# Patient Record
Sex: Female | Born: 1985 | Race: White | Hispanic: No | Marital: Single | Smoking: Never smoker
Health system: Southern US, Community
[De-identification: ages and names within clinical notes are randomized; demographics above are authoritative.]

## PROBLEM LIST (undated history)

## (undated) DIAGNOSIS — Z0282 Encounter for adoption services: Secondary | ICD-10-CM

## (undated) DIAGNOSIS — R7303 Prediabetes: Secondary | ICD-10-CM

## (undated) DIAGNOSIS — Z973 Presence of spectacles and contact lenses: Secondary | ICD-10-CM

## (undated) DIAGNOSIS — Z789 Other specified health status: Secondary | ICD-10-CM

## (undated) DIAGNOSIS — L732 Hidradenitis suppurativa: Secondary | ICD-10-CM

## (undated) HISTORY — PX: WISDOM TOOTH EXTRACTION: SHX21

---

## 2017-05-10 ENCOUNTER — Emergency Department
Admission: EM | Admit: 2017-05-10 | Discharge: 2017-05-10 | Disposition: A | Discharge status: Home / Self Care | Payer: BLUE CROSS/BLUE SHIELD | Source: Home / Self Care | Attending: Family Medicine | Admitting: Family Medicine

## 2017-05-10 ENCOUNTER — Encounter: Payer: Self-pay | Admitting: *Deleted

## 2017-05-10 ENCOUNTER — Other Ambulatory Visit: Payer: Self-pay

## 2017-05-10 ENCOUNTER — Emergency Department (INDEPENDENT_AMBULATORY_CARE_PROVIDER_SITE_OTHER): Payer: BLUE CROSS/BLUE SHIELD

## 2017-05-10 DIAGNOSIS — N946 Dysmenorrhea, unspecified: Secondary | ICD-10-CM | POA: Diagnosis not present

## 2017-05-10 DIAGNOSIS — N920 Excessive and frequent menstruation with regular cycle: Secondary | ICD-10-CM | POA: Diagnosis not present

## 2017-05-10 HISTORY — DX: Prediabetes: R73.03

## 2017-05-10 LAB — POCT CBC W AUTO DIFF (K'VILLE URGENT CARE)

## 2017-05-10 LAB — POCT URINE PREGNANCY: PREG TEST UR: NEGATIVE

## 2017-05-10 MED ORDER — MEFENAMIC ACID 250 MG PO CAPS: ORAL_CAPSULE | ORAL | 0 refills | Status: DC

## 2017-05-10 NOTE — ED Provider Notes (Signed)
[facility]    CSN: [id number] Arrival date & time: 05/10/17  1122     History   Chief Complaint Chief Complaint  Patient presents with  . Menstrual Problem    HPI Gabrielle Armstrong is a 31 y.o. female.   Patient concerned that her present menstrual period, which started 2 days ago, is about one week late and has much heavier flow than usual.  Initially she had lower abdominal pain, now decreased, and she was changing pads about every 3 hours.  She has been tired but no fevers, chills, and sweats or nausea/vomiting.  Her periods are normally quite regular.  She denies vaginal discharge prior to her present menses.  No urinary symptoms.   The history is provided by the patient.  Abdominal Pain  Pain location:  Suprapubic Pain quality: aching   Pain radiates to:  Does not radiate Pain severity:  Moderate Onset quality:  Sudden Duration:  2 days Timing:  Constant Progression:  Improving Context comment:  Onset with menses Relieved by:  Nothing Worsened by:  Movement Ineffective treatments:  None tried Associated symptoms: fatigue and vaginal bleeding   Associated symptoms: no anorexia, no chest pain, no chills, no constipation, no cough, no diarrhea, no dysuria, no fever, no hematemesis, no hematochezia, no hematuria, no melena, no nausea, no shortness of breath, no sore throat, no vaginal discharge and no vomiting     Past Medical History:  Diagnosis Date  . Pre-diabetes     There are no active problems to display for this patient.   History reviewed. No pertinent surgical history.  OB History    No data available       Home Medications    Prior to Admission medications   Medication Sig Start Date End Date Taking? Authorizing Provider  Mefenamic Acid 250 MG CAPS Take two tabs PO stat, then one q6hr PRN for up to 3 days.  Take with food. 05/10/17   Kandra Nicolas, MD    Family History Family History  Problem Relation Age of Onset  .  Hypertension Mother   . Diabetes Mother   . Heart disease Mother   . Hyperlipidemia Mother   . Hypertension Father   . Diabetes Father   . Heart disease Father   . Hyperlipidemia Father     Social History Social History   Tobacco Use  . Smoking status: Former Research scientist (life sciences)  . Smokeless tobacco: Never Used  Substance Use Topics  . Alcohol use: No    Frequency: Never  . Drug use: No     Allergies   Patient has no known allergies.   Review of Systems Review of Systems  Constitutional: Positive for fatigue. Negative for chills and fever.  HENT: Negative for sore throat.   Respiratory: Negative for cough and shortness of breath.   Cardiovascular: Negative for chest pain.  Gastrointestinal: Positive for abdominal pain. Negative for anorexia, constipation, diarrhea, hematemesis, hematochezia, melena, nausea and vomiting.  Genitourinary: Positive for vaginal bleeding. Negative for dysuria, hematuria and vaginal discharge.  All other systems reviewed and are negative.    Physical Exam Triage Vital Signs ED Triage Vitals  Enc Vitals Group     BP 05/10/17 1156 118/77     Pulse Rate 05/10/17 1156 82     Resp 05/10/17 1156 16     Temp 05/10/17 1156 98.4 F (36.9 C)     Temp Source 05/10/17 1156 Oral     SpO2 05/10/17 1156 99 %  Weight 05/10/17 1157 187 lb (84.8 kg)     Height 05/10/17 1157 5' 5.5" (1.664 m)     Head Circumference --      Peak Flow --      Pain Score 05/10/17 1157 4     Pain Loc --      Pain Edu? --      Excl. in [place]? --    Orthostatic VS for the past 24 hrs:  BP- Lying Pulse- Lying BP- Sitting Pulse- Sitting BP- Standing at 0 minutes Pulse- Standing at 0 minutes  05/10/17 1336 114/76 80 120/80 84 116/79 99    Updated Vital Signs BP 118/77 (BP Location: Left Arm)   Pulse 82   Temp 98.4 F (36.9 C) (Oral)   Resp 16   Ht 5' 5.5" (1.664 m)   Wt 187 lb (84.8 kg)   LMP 05/08/2017   SpO2 99%   BMI 30.65 kg/m   Visual Acuity Right Eye Distance:     Left Eye Distance:   Bilateral Distance:    Right Eye Near:   Left Eye Near:    Bilateral Near:     Physical Exam Nursing notes and Vital Signs reviewed. Appearance:  Patient appears stated age, and in no acute distress.    Eyes:  Pupils are equal, round, and reactive to light and accomodation.  Extraocular movement is intact.  Conjunctivae are not inflamed   Pharynx:  Normal; moist mucous membranes  Neck:  Supple.  No adenopathy Lungs:  Clear to auscultation.  Breath sounds are equal.  Moving air well. Heart:  Regular rate and rhythm without murmurs, rubs, or gallops.  Abdomen:  Vague lower abdominal midline tenderness without  masses or hepatosplenomegaly.  Bowel sounds are present.  No CVA or flank tenderness.  Extremities:  No edema.  Skin:  No rash present.     Pelvic exam deferred.  UC Treatments / Results  Labs (all labs ordered are listed, but only abnormal results are displayed) Labs Reviewed  HCG, QUANTITATIVE, PREGNANCY  POCT URINE PREGNANCY negative  POCT CBC W AUTO DIFF ([place]):  WBC 7.4; LY 28.0; MO 6.5; GR 65.5; Hgb 14.6; Platelets 350     EKG  EKG Interpretation None       Radiology US Pelvic Complete With Transvaginal  Result Date: 05/10/2017 CLINICAL DATA:  Heavy vaginal bleeding for the past 3 days. EXAM: TRANSABDOMINAL AND TRANSVAGINAL ULTRASOUND OF PELVIS TECHNIQUE: Both transabdominal and transvaginal ultrasound examinations of the pelvis were performed. Transabdominal technique was performed for global imaging of the pelvis including uterus, ovaries, adnexal regions, and pelvic cul-de-sac. It was necessary to proceed with endovaginal exam following the transabdominal exam to visualize the uterus, endometrium, and ovaries. COMPARISON:  None. FINDINGS: Uterus Measurements: 8.7 x 4.9 x 5.3 cm. Slightly heterogeneous in echotexture. No fibroids or other mass visualized. Endometrium Thickness: 7 mm. Heterogeneous with some internal  vascularity. No focal abnormality visualized. Right ovary Measurements: 4.5 x 2.4 x 2.5 cm. Normal appearance/no adnexal mass. Left ovary Measurements: 2.9 x 2.8 x 3.0 cm. Normal appearance/no adnexal mass. Other findings No abnormal free fluid. IMPRESSION: 1. Heterogeneous endometrium, within normal limits in thickness. No focal abnormality. If bleeding remains unresponsive to hormonal or medical therapy, sonohysterogram should be considered for focal lesion work-up. (Ref: Radiological Reasoning: Algorithmic Workup of Abnormal Vaginal Bleeding with Endovaginal Sonography and Sonohysterography. AJR 2008; 326:Z12-45) Electronically Signed   By: Titus Dubin M.D.   On: 05/10/2017 15:38    Procedures Procedures (  including critical care time)  Medications Ordered in UC Medications - No data to display   Initial Impression / Assessment and Plan / UC Course  I have reviewed the triage vital signs and the nursing notes.  Pertinent labs & imaging results that were available during my care of the patient were reviewed by me and considered in my medical decision making (see chart for details).    Normal CBC, negative urine pregnancy test, and negative pelvic ultrasound reassuring.  Check quantitative HCG. Begin [place]. Followup with GYN in about 3 days. If symptoms become significantly worse during the night or over the weekend, proceed to the local emergency room.     Final Clinical Impressions(s) / UC Diagnoses   Final diagnoses:  Dysmenorrhea  Menorrhagia with regular cycle    ED Discharge Orders        Ordered    Mefenamic Acid 250 MG CAPS     05/10/17 1607           Kandra Nicolas, MD 05/11/17 1132

## 2017-05-10 NOTE — ED Triage Notes (Signed)
Pt c/o abnormally heavy menstrual cycle with abd cramping x 3 days. She reports having normal periods for the past 6 mths.

## 2017-05-10 NOTE — ED Notes (Signed)
Blood drawn from LT antecubital. Attempted 1 stick. Pt tolerated procedure well. Charna Archer, LPN

## 2017-05-10 NOTE — Discharge Instructions (Signed)
If symptoms become significantly worse during the night or over the weekend, proceed to the local emergency room.  

## 2017-05-11 LAB — HCG, QUANTITATIVE, PREGNANCY: HCG, Total, QN: <2 m[IU]/mL

## 2017-05-12 ENCOUNTER — Telehealth: Payer: Self-pay

## 2017-05-12 NOTE — Telephone Encounter (Signed)
Attempted to call x2 and number not in service.

## 2017-08-30 ENCOUNTER — Encounter (HOSPITAL_BASED_OUTPATIENT_CLINIC_OR_DEPARTMENT_OTHER): Payer: Self-pay | Admitting: *Deleted

## 2017-08-30 ENCOUNTER — Other Ambulatory Visit: Payer: Self-pay

## 2017-08-30 NOTE — Progress Notes (Signed)
SPOKE W/ PT VIA PHONE FOR PRE-OP INTERVIEW.  NPO AFTER MN W/ EXCEPTION CLEAR LIQUIDS UNTIL 0900 (NO CREAM / MILK PRODUCTS). ARRIVE AT 1300.  NEEDS ISTAT AND EKG.  PT VERBALIZED UNDERSTANDING NO SHAVING 48 HOURS PRIOR TO SURGERY, DO NOT WEAR DEODERANT , LOTIONS, OR POWDERS.   PRE-OR ORDERS PENDING,  LVM FOR BRENDA GARRETT.

## 2017-08-31 ENCOUNTER — Ambulatory Visit: Payer: Self-pay | Admitting: Surgery

## 2017-09-01 ENCOUNTER — Encounter (HOSPITAL_BASED_OUTPATIENT_CLINIC_OR_DEPARTMENT_OTHER): Payer: Self-pay

## 2017-09-01 ENCOUNTER — Encounter (HOSPITAL_BASED_OUTPATIENT_CLINIC_OR_DEPARTMENT_OTHER)
Admission: RE | Disposition: A | Discharge status: Home / Self Care | Payer: Self-pay | Source: Ambulatory Visit | Attending: Surgery

## 2017-09-01 ENCOUNTER — Ambulatory Visit (HOSPITAL_BASED_OUTPATIENT_CLINIC_OR_DEPARTMENT_OTHER): Payer: BLUE CROSS/BLUE SHIELD | Admitting: Anesthesiology

## 2017-09-01 ENCOUNTER — Ambulatory Visit (HOSPITAL_BASED_OUTPATIENT_CLINIC_OR_DEPARTMENT_OTHER)
Admission: RE | Admit: 2017-09-01 | Discharge: 2017-09-01 | Disposition: A | Discharge status: Home / Self Care | Payer: BLUE CROSS/BLUE SHIELD | Source: Ambulatory Visit | Attending: Surgery | Admitting: Surgery

## 2017-09-01 DIAGNOSIS — C44529 Squamous cell carcinoma of skin of other part of trunk: Secondary | ICD-10-CM | POA: Diagnosis not present

## 2017-09-01 DIAGNOSIS — K644 Residual hemorrhoidal skin tags: Secondary | ICD-10-CM | POA: Diagnosis not present

## 2017-09-01 DIAGNOSIS — L732 Hidradenitis suppurativa: Secondary | ICD-10-CM | POA: Diagnosis present

## 2017-09-01 HISTORY — DX: Presence of spectacles and contact lenses: Z97.3

## 2017-09-01 HISTORY — DX: Hidradenitis suppurativa: L73.2

## 2017-09-01 HISTORY — PX: HYDRADENITIS EXCISION: SHX5243

## 2017-09-01 HISTORY — DX: Other specified health status: Z78.9

## 2017-09-01 HISTORY — PX: RECTAL EXAM UNDER ANESTHESIA: SHX6399

## 2017-09-01 HISTORY — DX: Encounter for adoption services: Z02.82

## 2017-09-01 LAB — POCT PREGNANCY, URINE: PREG TEST UR: NEGATIVE

## 2017-09-01 LAB — GLUCOSE, CAPILLARY: GLUCOSE-CAPILLARY: 90 mg/dL (ref 65–99)

## 2017-09-01 SURGERY — EXCISION, HIDRADENITIS, INGUINAL REGION
Anesthesia: General | Site: Buttocks

## 2017-09-01 MED ORDER — BACITRACIN ZINC 500 UNIT/GM EX OINT
TOPICAL_OINTMENT | CUTANEOUS | Status: AC
  Filled 2017-09-01: qty 28.35

## 2017-09-01 MED ORDER — CHLORHEXIDINE GLUCONATE CLOTH 2 % EX PADS
6.0000 | MEDICATED_PAD | Freq: Once | CUTANEOUS | Status: DC
  Filled 2017-09-01: qty 6

## 2017-09-01 MED ORDER — LIDOCAINE 2% (20 MG/ML) 5 ML SYRINGE
INTRAMUSCULAR | Status: DC | PRN
  Administered 2017-09-01: 60 mg via INTRAVENOUS

## 2017-09-01 MED ORDER — BUPIVACAINE LIPOSOME 1.3 % IJ SUSP
INTRAMUSCULAR | Status: DC | PRN
  Administered 2017-09-01: 50 mL

## 2017-09-01 MED ORDER — SUCCINYLCHOLINE CHLORIDE 200 MG/10ML IV SOSY
PREFILLED_SYRINGE | INTRAVENOUS | Status: DC | PRN
  Administered 2017-09-01: 140 mg via INTRAVENOUS

## 2017-09-01 MED ORDER — CEFAZOLIN SODIUM-DEXTROSE 2-4 GM/100ML-% IV SOLN
2.0000 g | INTRAVENOUS | Status: AC
  Administered 2017-09-01: 2 g via INTRAVENOUS
  Filled 2017-09-01: qty 100

## 2017-09-01 MED ORDER — ROCURONIUM BROMIDE 50 MG/5ML IV SOSY: PREFILLED_SYRINGE | INTRAVENOUS | Status: DC | PRN

## 2017-09-01 MED ORDER — MIDAZOLAM HCL 2 MG/2ML IJ SOLN
INTRAMUSCULAR | Status: DC | PRN
  Administered 2017-09-01: 2 mg via INTRAVENOUS

## 2017-09-01 MED ORDER — PROPOFOL 10 MG/ML IV BOLUS
INTRAVENOUS | Status: AC
  Filled 2017-09-01: qty 20

## 2017-09-01 MED ORDER — MIDAZOLAM HCL 2 MG/2ML IJ SOLN
INTRAMUSCULAR | Status: AC
  Filled 2017-09-01: qty 2

## 2017-09-01 MED ORDER — LIDOCAINE 2% (20 MG/ML) 5 ML SYRINGE
INTRAMUSCULAR | Status: AC
  Filled 2017-09-01: qty 5

## 2017-09-01 MED ORDER — FENTANYL CITRATE (PF) 100 MCG/2ML IJ SOLN
INTRAMUSCULAR | Status: AC
  Filled 2017-09-01: qty 2

## 2017-09-01 MED ORDER — GABAPENTIN 300 MG PO CAPS
300.0000 mg | ORAL_CAPSULE | ORAL | Status: AC
  Administered 2017-09-01: 300 mg via ORAL
  Filled 2017-09-01: qty 1

## 2017-09-01 MED ORDER — ONDANSETRON HCL 4 MG/2ML IJ SOLN
INTRAMUSCULAR | Status: DC | PRN
  Administered 2017-09-01: 4 mg via INTRAVENOUS

## 2017-09-01 MED ORDER — ROCURONIUM BROMIDE 10 MG/ML (PF) SYRINGE
PREFILLED_SYRINGE | INTRAVENOUS | Status: AC
  Filled 2017-09-01: qty 5

## 2017-09-01 MED ORDER — GABAPENTIN 300 MG PO CAPS
ORAL_CAPSULE | ORAL | Status: AC
  Filled 2017-09-01: qty 1

## 2017-09-01 MED ORDER — HEPARIN SODIUM (PORCINE) 5000 UNIT/ML IJ SOLN
INTRAMUSCULAR | Status: AC
  Filled 2017-09-01: qty 1

## 2017-09-01 MED ORDER — ACETAMINOPHEN 500 MG PO TABS
1000.0000 mg | ORAL_TABLET | ORAL | Status: AC
  Administered 2017-09-01: 1000 mg via ORAL
  Filled 2017-09-01: qty 2

## 2017-09-01 MED ORDER — LACTATED RINGERS IV SOLN
INTRAVENOUS | Status: DC
  Administered 2017-09-01 (×2): via INTRAVENOUS
  Filled 2017-09-01: qty 1000

## 2017-09-01 MED ORDER — PROPOFOL 10 MG/ML IV BOLUS
INTRAVENOUS | Status: DC | PRN
  Administered 2017-09-01: 180 mg via INTRAVENOUS

## 2017-09-01 MED ORDER — SODIUM CHLORIDE 0.9% FLUSH
INTRAVENOUS | Status: DC | PRN
  Administered 2017-09-01: 500 mL

## 2017-09-01 MED ORDER — CEFAZOLIN SODIUM-DEXTROSE 2-4 GM/100ML-% IV SOLN
INTRAVENOUS | Status: AC
  Filled 2017-09-01: qty 100

## 2017-09-01 MED ORDER — ACETAMINOPHEN 500 MG PO TABS
ORAL_TABLET | ORAL | Status: AC
  Filled 2017-09-01: qty 2

## 2017-09-01 MED ORDER — DEXAMETHASONE SODIUM PHOSPHATE 10 MG/ML IJ SOLN
INTRAMUSCULAR | Status: DC | PRN
  Administered 2017-09-01: 10 mg via INTRAVENOUS

## 2017-09-01 MED ORDER — CELECOXIB 200 MG PO CAPS
200.0000 mg | ORAL_CAPSULE | ORAL | Status: AC
  Administered 2017-09-01: 200 mg via ORAL
  Filled 2017-09-01: qty 1

## 2017-09-01 MED ORDER — PHENYLEPHRINE 40 MCG/ML (10ML) SYRINGE FOR IV PUSH (FOR BLOOD PRESSURE SUPPORT)
PREFILLED_SYRINGE | INTRAVENOUS | Status: AC
  Filled 2017-09-01: qty 10

## 2017-09-01 MED ORDER — TRAMADOL HCL 50 MG PO TABS
50.0000 mg | ORAL_TABLET | Freq: Four times a day (QID) | ORAL | 0 refills | Status: AC | PRN
Start: 2017-09-01 — End: 2017-09-06

## 2017-09-01 MED ORDER — ROCURONIUM BROMIDE 10 MG/ML (PF) SYRINGE
PREFILLED_SYRINGE | INTRAVENOUS | Status: DC | PRN
  Administered 2017-09-01: 30 mg via INTRAVENOUS

## 2017-09-01 MED ORDER — EPHEDRINE 5 MG/ML INJ
INTRAVENOUS | Status: AC
  Filled 2017-09-01: qty 10

## 2017-09-01 MED ORDER — SUCCINYLCHOLINE CHLORIDE 200 MG/10ML IV SOSY
PREFILLED_SYRINGE | INTRAVENOUS | Status: AC
  Filled 2017-09-01: qty 10

## 2017-09-01 MED ORDER — DEXAMETHASONE SODIUM PHOSPHATE 10 MG/ML IJ SOLN
INTRAMUSCULAR | Status: AC
  Filled 2017-09-01: qty 1

## 2017-09-01 MED ORDER — FENTANYL CITRATE (PF) 100 MCG/2ML IJ SOLN
INTRAMUSCULAR | Status: DC | PRN
  Administered 2017-09-01 (×2): 50 ug via INTRAVENOUS

## 2017-09-01 MED ORDER — PHENYLEPHRINE 40 MCG/ML (10ML) SYRINGE FOR IV PUSH (FOR BLOOD PRESSURE SUPPORT)
PREFILLED_SYRINGE | INTRAVENOUS | Status: DC | PRN
  Administered 2017-09-01: 80 ug via INTRAVENOUS
  Administered 2017-09-01: 40 ug via INTRAVENOUS

## 2017-09-01 MED ORDER — ONDANSETRON HCL 4 MG/2ML IJ SOLN
INTRAMUSCULAR | Status: AC
  Filled 2017-09-01: qty 2

## 2017-09-01 MED ORDER — SUGAMMADEX SODIUM 200 MG/2ML IV SOLN
INTRAVENOUS | Status: DC | PRN
  Administered 2017-09-01: 150 mg via INTRAVENOUS

## 2017-09-01 MED ORDER — SUGAMMADEX SODIUM 200 MG/2ML IV SOLN
INTRAVENOUS | Status: AC
  Filled 2017-09-01: qty 2

## 2017-09-01 MED ORDER — BUPIVACAINE-EPINEPHRINE 0.25% -1:200000 IJ SOLN
INTRAMUSCULAR | Status: DC | PRN
  Administered 2017-09-01: 50 mL

## 2017-09-01 MED ORDER — HEPARIN SODIUM (PORCINE) 5000 UNIT/ML IJ SOLN
5000.0000 [IU] | Freq: Once | INTRAMUSCULAR | Status: AC
  Administered 2017-09-01: 5000 [IU] via SUBCUTANEOUS
  Filled 2017-09-01: qty 1

## 2017-09-01 MED ORDER — CELECOXIB 200 MG PO CAPS
ORAL_CAPSULE | ORAL | Status: AC
  Filled 2017-09-01: qty 1

## 2017-09-01 SURGICAL SUPPLY — 52 items
BENZOIN TINCTURE PRP APPL 2/3 (GAUZE/BANDAGES/DRESSINGS) ×4 IMPLANT
BLADE EXTENDED COATED 6.5IN (ELECTRODE) IMPLANT
BLADE HEX COATED 2.75 (ELECTRODE) ×4 IMPLANT
BLADE SURG 10 STRL SS (BLADE) IMPLANT
BLADE SURG 15 STRL LF DISP TIS (BLADE) ×2 IMPLANT
BLADE SURG 15 STRL SS (BLADE) ×2
BRIEF STRETCH FOR OB PAD LRG (UNDERPADS AND DIAPERS) ×4 IMPLANT
CANISTER SUCT 3000ML PPV (MISCELLANEOUS) ×4 IMPLANT
COVER BACK TABLE 60X90IN (DRAPES) ×4 IMPLANT
COVER MAYO STAND STRL (DRAPES) ×4 IMPLANT
DECANTER SPIKE VIAL GLASS SM (MISCELLANEOUS) ×4 IMPLANT
DERMABOND ADVANCED (GAUZE/BANDAGES/DRESSINGS) ×2
DERMABOND ADVANCED .7 DNX12 (GAUZE/BANDAGES/DRESSINGS) ×2 IMPLANT
DRAIN PENROSE 18X1/4 LTX STRL (WOUND CARE) ×4 IMPLANT
DRAPE LAPAROTOMY 100X72 PEDS (DRAPES) ×4 IMPLANT
DRAPE LG THREE QUARTER DISP (DRAPES) IMPLANT
DRAPE UTILITY XL STRL (DRAPES) ×4 IMPLANT
DRSG TEGADERM 2-3/8X2-3/4 SM (GAUZE/BANDAGES/DRESSINGS) ×12 IMPLANT
DRSG TEGADERM 4X4.75 (GAUZE/BANDAGES/DRESSINGS) ×4 IMPLANT
ELECT REM PT RETURN 9FT ADLT (ELECTROSURGICAL) ×4
ELECTRODE REM PT RTRN 9FT ADLT (ELECTROSURGICAL) ×2 IMPLANT
GAUZE SPONGE 4X4 12PLY STRL (GAUZE/BANDAGES/DRESSINGS) ×4 IMPLANT
GAUZE SPONGE 4X4 16PLY NS LF (WOUND CARE) IMPLANT
GAUZE SPONGE 4X4 16PLY XRAY LF (GAUZE/BANDAGES/DRESSINGS) IMPLANT
GAUZE VASELINE 3X9 (GAUZE/BANDAGES/DRESSINGS) IMPLANT
GLOVE BIO SURGEON STRL SZ7.5 (GLOVE) ×4 IMPLANT
GLOVE INDICATOR 8.0 STRL GRN (GLOVE) ×4 IMPLANT
GOWN STRL REUS W/ TWL LRG LVL3 (GOWN DISPOSABLE) ×2 IMPLANT
GOWN STRL REUS W/TWL LRG LVL3 (GOWN DISPOSABLE) ×2
KIT TURNOVER CYSTO (KITS) ×4 IMPLANT
NDL SAFETY ECLIPSE 18X1.5 (NEEDLE) IMPLANT
NEEDLE HYPO 18GX1.5 SHARP (NEEDLE)
NEEDLE HYPO 22GX1.5 SAFETY (NEEDLE) ×4 IMPLANT
NS IRRIG 500ML POUR BTL (IV SOLUTION) ×4 IMPLANT
PACK BASIN DAY SURGERY FS (CUSTOM PROCEDURE TRAY) ×4 IMPLANT
PAD ABD 8X10 STRL (GAUZE/BANDAGES/DRESSINGS) ×4 IMPLANT
PAD ARMBOARD 7.5X6 YLW CONV (MISCELLANEOUS) ×8 IMPLANT
PENCIL BUTTON HOLSTER BLD 10FT (ELECTRODE) ×4 IMPLANT
SPONGE LAP 18X18 X RAY DECT (DISPOSABLE) IMPLANT
SPONGE LAP 4X18 X RAY DECT (DISPOSABLE) IMPLANT
SPONGE SURGIFOAM ABS GEL 12-7 (HEMOSTASIS) IMPLANT
SUT ETHILON 2 0 PS N (SUTURE) ×8 IMPLANT
SUT VIC AB 2-0 SH 27 (SUTURE) ×2
SUT VIC AB 2-0 SH 27XBRD (SUTURE) ×2 IMPLANT
SUT VIC AB 3-0 SH 18 (SUTURE) ×4 IMPLANT
SYR BULB IRRIGATION 50ML (SYRINGE) ×4 IMPLANT
SYR CONTROL 10ML LL (SYRINGE) ×4 IMPLANT
TOWEL OR 17X24 6PK STRL BLUE (TOWEL DISPOSABLE) ×8 IMPLANT
TRAY DSU PREP LF (CUSTOM PROCEDURE TRAY) ×4 IMPLANT
TUBE CONNECTING 12'X1/4 (SUCTIONS) ×1
TUBE CONNECTING 12X1/4 (SUCTIONS) ×3 IMPLANT
YANKAUER SUCT BULB TIP NO VENT (SUCTIONS) ×4 IMPLANT

## 2017-09-01 NOTE — Anesthesia Postprocedure Evaluation (Signed)
Anesthesia Post Note  Patient: Gabrielle Armstrong  Procedure(s) Performed: EXCISION HIDRADENITIS OF LEFT BUTTOCK (Left Buttocks) ANAL EXAM UNDER ANESTHESIA (N/A )     Patient location during evaluation: PACU Anesthesia Type: General Level of consciousness: awake Pain management: pain level controlled Vital Signs Assessment: post-procedure vital signs reviewed and stable Respiratory status: spontaneous breathing Cardiovascular status: stable Anesthetic complications: no    Last Vitals:  Vitals:   09/01/17 1235  BP: 130/78  Pulse: 89  Resp: 16  Temp: 37.1 C  SpO2: 99%    Last Pain:  Vitals:   09/01/17 1325  TempSrc:   PainSc: 2                  Jaxon Flatt

## 2017-09-01 NOTE — Anesthesia Procedure Notes (Signed)
Procedure Name: Intubation Date/Time: 09/01/2017 3:30 PM Performed by: Suan Halter, CRNA Pre-anesthesia Checklist: Patient identified, Emergency Drugs available, Suction available and Patient being monitored Patient Re-evaluated:Patient Re-evaluated prior to induction Oxygen Delivery Method: Circle system utilized Preoxygenation: Pre-oxygenation with 100% oxygen Induction Type: IV induction Ventilation: Mask ventilation without difficulty Laryngoscope Size: Mac and 3 Grade View: Grade I Tube type: Oral Tube size: 7.0 mm Number of attempts: 1 Airway Equipment and Method: Stylet and Oral airway Placement Confirmation: ETT inserted through vocal cords under direct vision,  positive ETCO2 and breath sounds checked- equal and bilateral Secured at: 22 cm Tube secured with: Tape Dental Injury: Teeth and Oropharynx as per pre-operative assessment

## 2017-09-01 NOTE — Anesthesia Preprocedure Evaluation (Addendum)
Anesthesia Evaluation  Patient identified by MRN, date of birth, ID band Patient awake    Reviewed: Allergy & Precautions, NPO status , Patient's Chart, lab work & pertinent test results  Airway Mallampati: III  TM Distance: <3 FB Neck ROM: Full    Dental no notable dental hx.    Pulmonary neg pulmonary ROS,    Pulmonary exam normal breath sounds clear to auscultation       Cardiovascular negative cardio ROS Normal cardiovascular exam Rhythm:Regular Rate:Normal     Neuro/Psych negative neurological ROS  negative psych ROS   GI/Hepatic negative GI ROS, Neg liver ROS,   Endo/Other  negative endocrine ROS  Renal/GU negative Renal ROS  negative genitourinary   Musculoskeletal negative musculoskeletal ROS (+)   Abdominal   Peds negative pediatric ROS (+)  Hematology negative hematology ROS (+)   Anesthesia Other Findings   Reproductive/Obstetrics negative OB ROS                            Anesthesia Physical Anesthesia Plan  ASA: I  Anesthesia Plan: General   Post-op Pain Management:    Induction: Intravenous  PONV Risk Score and Plan: 3 and Ondansetron, Dexamethasone, Midazolam and Treatment may vary due to age or medical condition  Airway Management Planned: Oral ETT  Additional Equipment:   Intra-op Plan:   Post-operative Plan: Extubation in OR  Informed Consent: I have reviewed the patients History and Physical, chart, labs and discussed the procedure including the risks, benefits and alternatives for the proposed anesthesia with the patient or authorized representative who has indicated his/her understanding and acceptance.   Dental advisory given  Plan Discussed with: CRNA and Surgeon  Anesthesia Plan Comments:         Anesthesia Quick Evaluation

## 2017-09-01 NOTE — Anesthesia Postprocedure Evaluation (Signed)
Anesthesia Post Note  Patient: Gabrielle Armstrong  Procedure(s) Performed: EXCISION HIDRADENITIS OF LEFT BUTTOCK (Left Buttocks) ANAL EXAM UNDER ANESTHESIA (N/A )     Patient location during evaluation: PACU Anesthesia Type: General Level of consciousness: awake Pain management: pain level controlled Respiratory status: spontaneous breathing Cardiovascular status: stable Anesthetic complications: no    Last Vitals:  Vitals:   09/01/17 1235  BP: 130/78  Pulse: 89  Resp: 16  Temp: 37.1 C  SpO2: 99%    Last Pain:  Vitals:   09/01/17 1325  TempSrc:   PainSc: 2                  Masiah Lewing

## 2017-09-01 NOTE — Discharge Instructions (Addendum)
POST OP INSTRUCTIONS  1. DIET: As tolerated. Follow a light bland diet the first 24 hours after arrival home, such as soup, liquids, crackers, etc.  Be sure to include lots of fluids daily.  Avoid fast food or heavy meals as your are more likely to get nauseated.  Eat a low fat the next few days after surgery.  2. Take your usually prescribed home medications unless otherwise directed.  3. PAIN CONTROL: a. Pain is best controlled by a usual combination of three different methods TOGETHER: i. Ice/Heat ii. Over the counter pain medication iii. Prescription pain medication b. Most patients will experience some swelling and bruising around the surgical site.  Ice packs or heating pads (30-60 minutes up to 6 times a day) will help. Some people prefer to use ice alone, heat alone, alternating between ice & heat.  Experiment to what works for you.  Swelling and bruising can take several weeks to resolve.   c. It is helpful to take an over-the-counter pain medication regularly for the first few weeks: i. Ibuprofen (Motrin/Advil) - 200mg  tabs - take 3 tabs (600mg ) every 6 hours as needed for pain ii. Acetaminophen (Tylenol) - you may take 650mg  every 6 hours as needed. You can take this with motrin as they act differently on the body. If you are taking a narcotic pain medication that has acetaminophen in it, do not take over the counter tylenol at the same time.  Iii. NOTE: You may take both of these medications together - most patients  find it most helpful when alternating between the two (i.e. Ibuprofen at 6am,  tylenol at 9am, ibuprofen at 12pm ...) d. A  prescription for pain medication should be given to you upon discharge.  Take your pain medication as prescribed if your pain is not adequatly controlled with the over-the-counter pain reliefs mentioned above.  4. Avoid getting constipated.  Between the surgery and the pain medications, it is common to experience some constipation.  Increasing fluid  intake and taking a fiber supplement (such as Metamucil, Citrucel, FiberCon, MiraLax, etc) 1-2 times a day regularly will usually help prevent this problem from occurring.  A mild laxative (prune juice, Milk of Magnesia, MiraLax, etc) should be taken according to package directions if there are no bowel movements after 48 hours.    5. Dressing: Your incision is covered in with a dressing. Try to let this remain in place for 2 days if possible. If it were to come off sooner (I.e. During bowel movement, etc) it's ok to leave it off but try to wipe all stool away from the wound and not towards it. Keep your wound dry for the next 48 hours. Following this, it is ok to get it wet in the shower (letting soap/water run over it). Avoid baths/pools/lakes/oceans until your wounds have fully healed (typically not until your sutures have been removed). The sutures will remain in place for 14 days.  6. ACTIVITIES as tolerated:   a. You may resume regular (light) daily activities beginning the next day--such as daily self-care, walking, climbing stairs--gradually increasing activities as tolerated.  If you can walk 30 minutes without difficulty, it is safe to try more intense activity such as jogging, treadmill, bicycling, low-impact aerobics.  b. DO NOT PUSH THROUGH PAIN.  Let pain be your guide: If it hurts to do something, don't do it. c. You may drive when you are no longer taking prescription pain medication, you can comfortably wear a seatbelt, and you  can safely maneuver your car and apply brakes. d. Dennis Bast may have sexual intercourse once your wounds have healed.  7. FOLLOW UP in our office a. Please call [facility] at [phone] to set up an appointment to see your surgeon in the office for a follow-up appointment approximately 2 weeks after your surgery. b. Make sure that you call for this appointment the day you arrive home to insure a convenient appointment time.  9. If you have disability or family leave  forms that need to be completed, you may have them completed by your primary care physician's office; for return to work instructions, please ask our office staff and they will be happy to assist you in obtaining this documentation   When to call us [phone]: 1. Poor pain control 2. Reactions / problems with new medications (rash/itching, etc)  3. Fever over 101.5 F (38.5 C) 4. Inability to urinate 5. Nausea/vomiting 6. Worsening swelling or bruising 7. Continued bleeding from incision. 8. Increased pain, redness, or drainage from the incision  The clinic staff is available to answer your questions during regular business hours (8:30am-5pm).  Please dont hesitate to call and ask to speak to one of our nurses for clinical concerns.   A surgeon from [facility] is always on call at the hospitals   If you have a medical emergency, go to the nearest emergency room or call 911.  [facility], [address], [place], [place], [place]  [address] MAIN: [phone] FAX: [phone] [email or web address]   Post Anesthesia Home Care Instructions  Activity: Get plenty of rest for the remainder of the day. A responsible individual must stay with you for 24 hours following the procedure.  For the next 24 hours, DO NOT: -Drive a car -Paediatric nurse -Drink alcoholic beverages -Take any medication unless instructed by your physician -Make any legal decisions or sign important papers.  Meals: Start with liquid foods such as gelatin or soup. Progress to regular foods as tolerated. Avoid greasy, spicy, heavy foods. If nausea and/or vomiting occur, drink only clear liquids until the nausea and/or vomiting subsides. Call your physician if vomiting continues.  Special Instructions/Symptoms: Your throat may feel dry or sore from the anesthesia or the breathing tube placed in your throat during surgery. If this causes discomfort, gargle with  warm salt water. The discomfort should disappear within 24 hours.  If you had a scopolamine patch placed behind your ear for the management of post- operative nausea and/or vomiting:  1. The medication in the patch is effective for 72 hours, after which it should be removed.  Wrap patch in a tissue and discard in the trash. Wash hands thoroughly with soap and water. 2. You may remove the patch earlier than 72 hours if you experience unpleasant side effects which may include dry mouth, dizziness or visual disturbances. 3. Avoid touching the patch. Wash your hands with soap and water after contact with the patch.   Information for Discharge Teaching: EXPAREL (bupivacaine liposome injectable suspension)   Your surgeon gave you EXPAREL(bupivacaine) in your surgical incision to help control your pain after surgery.   EXPAREL is a local anesthetic that provides pain relief by numbing the tissue around the surgical site.  EXPAREL is designed to release pain medication over time and can control pain for up to 72 hours.  Depending on how you respond to EXPAREL, you may require less pain medication during your recovery.  Possible side effects:  Temporary  loss of sensation or ability to move in the area where bupivacaine was injected.  Nausea, vomiting, constipation  Rarely, numbness and tingling in your mouth or lips, lightheadedness, or anxiety may occur.  Call your doctor right away if you think you may be experiencing any of these sensations, or if you have other questions regarding possible side effects.  Follow all other discharge instructions given to you by your surgeon or nurse. Eat a healthy diet and drink plenty of water or other fluids.  If you return to the hospital for any reason within 96 hours following the administration of EXPAREL, please inform your health care providers.

## 2017-09-01 NOTE — Op Note (Signed)
09/01/2017  4:56 PM  PATIENT:  Gabrielle Armstrong  32 y.o. female  Patient Care Team: Patient, No Pcp Per as PCP - General (General Practice)  PRE-OPERATIVE DIAGNOSIS:  hidradenititis of left buttock   POST-OPERATIVE DIAGNOSIS:  hidradenititis of left buttock  PROCEDURE:   1. Excision of left buttock hidradenitis 2. Anal exam under anesthesia  SURGEON:  Sharon [place] Blodgett, MD  ASSISTANT: Carlena Hurl, PA-C  ANESTHESIA:   general  COUNTS:  Sponge, needle and instrument counts were reported correct x2 at the conclusion of the operation.  EBL: 5cc  DRAINS: None  SPECIMEN: Left buttock hidradenitis  COMPLICATIONS: None  FINDINGS: External hemorrhoids. Anorectal exam demonstrated no evidence of internal opening/fistula. Left buttock hidradenitis - 9 x 5 cm, excised and wound closed per her preference.  DISPOSITION: PACU in satisfactory condition  INDICATION: Gabrielle Armstrong is a pleasant 32yoF who has a history dating back to December 2018 of a left buttock wound that has opened multiple times and drained. Most recently she developed increasing pain for which she came to our office to be evaluated for. She had been previously evaluated [facility] center [facility] who told her to come here. In the urgent office, she underwent incision and drainage of a left buttock wound - which resulted in purulent drainage. She does report a history over the past couple of years of areas of drainage in the left buttock in different locations. These all seemed to resolve on their own. The current one is in a different location from the prior. She denies any other areas of concern currently or in the past including the axilla or groins. She states that having this chronic draining wound is interfering with her quality of life and intimacy with her boyfriend. On exam, she had an area with 4 open draining sinuses on the left buttock, consistent with hidradenitis. Options were discussed and she  elected excision in the OR with wound closure. See notes for details regarding this discussion.  DESCRIPTION: The patient was identified in preop holding and taken to the OR where they were placed on the operating room table and SCDs were placed. General endotracheal anesthesia was induced without difficulty. The patient was then flipped prone onto the operating table. Pressure points were padded, and the table positioned in jackknife. A surgical timeout was performed indicating the correct patient, procedure, positioning and need for preoperative antibiotics.   Beginning with a digital rectal exam, no palpable abnormalities were noted. She did have external hemorrhoids. A Hill Ferguson retractor was placed in the anal canal and no abnormalities were identified including no detectable internal openings.  She was then prepped and draped in the usual sterile fashion for excision of the left buttock hidradenitis. The area of disease was marked out. The area was infiltrated with 0.25% marcaine/epi+Exparel. A scalpel was used to incise the skin. The disease was excised with electrocautery, extending into a depth of healthy non-diseased subcutaneous fat. The excised tissue measured 9 x 5cm. The wound was then irrigated with sterile saline and hemostasis achieved with electrocautery. The wound was then closed in 2 layers. Deep dermis was approximated with 3-0 vicryl suture. The skin was then closed with 2-0 nylon vertical mattress sutures. The wound was covered in bacitracin and covered with sterile gauze and secured with tegaderms. The patient was then taken out of prone position, extubated, and transferred to PACU in satisfactory condition.   Note: This dictation was prepared with Dragon/digital dictation along with [employer]. Any  transcriptional errors that result from this process are unintentional.

## 2017-09-01 NOTE — H&P (Signed)
CC: Draining left gluteal wound  HPI: Gabrielle Armstrong is a pleasant 66yoF here today in follow-up after seeing our PA, Gabrielle Armstrong, and the urgent office 2 days ago. She has a history dating back to December 2018 of a left buttock wound that has opened multiple times and drained. Most recently she developed increasing pain for which she came to our office to be evaluated for. She had been previously evaluated at the [facility] at [facility] who told her to come here. In the urgent office, she underwent incision and drainage of a left buttock wound. She does report a history over the past couple of years of areas of drainage in the left buttock in different locations. These all seemed to resolve on their own. The current one is in a different location from the prior. She denies any other areas of concern currently or in the past including the axilla or groins. She states that having this chronic draining wound is interfering with her quality of life and intimacy with her boyfriend  PMH: Denies  PSH: Denies  FHx: Denies FHx of malignancy  Social: Denies use of tobacco/drugs; social EtOH use. Works in [employer] as a Programme researcher, broadcasting/film/video.  ROS: A comprehensive 10 system review of systems was completed with the patient and pertinent findings as noted above.   Allergies No Known Drug Allergies Allergies Reconciled   Medication History No Current Medications Medications Reconciled    Review of Systems General Not Present- Appetite Loss, Chills, Fatigue, Fever, Night Sweats, Weight Gain and Weight Loss. Skin Not Present- Change in Wart/Mole, Dryness, Hives, Jaundice, New Lesions, Non-Healing Wounds, Rash and Ulcer. HEENT Not Present- Earache, Hearing Loss, Hoarseness, Nose Bleed, Oral Ulcers, Ringing in the Ears, Seasonal Allergies, Sinus Pain, Sore Throat, Visual Disturbances, Wears glasses/contact lenses and Yellow Eyes. Respiratory Not Present- Bloody sputum, Chronic Cough, Difficulty  Breathing, Snoring and Wheezing. Breast Not Present- Breast Mass, Breast Pain, Nipple Discharge and Skin Changes. Cardiovascular Not Present- Chest Pain, Difficulty Breathing Lying Down, Leg Cramps, Palpitations, Rapid Heart Rate, Shortness of Breath and Swelling of Extremities. Gastrointestinal Not Present- Abdominal Pain, Bloating, Bloody Stool, Change in Bowel Habits, Chronic diarrhea, Constipation, Difficulty Swallowing, Excessive gas, Gets full quickly at meals, Hemorrhoids, Indigestion, Nausea, Rectal Pain and Vomiting. Female Genitourinary Not Present- Frequency, Nocturia, Painful Urination, Pelvic Pain and Urgency. Musculoskeletal Not Present- Back Pain, Joint Pain, Joint Stiffness, Muscle Pain, Muscle Weakness and Swelling of Extremities. Neurological Not Present- Decreased Memory, Fainting, Headaches, Numbness, Seizures, Tingling, Tremor, Trouble walking and Weakness. Psychiatric Not Present- Anxiety, Bipolar, Change in Sleep Pattern, Depression, Fearful and Frequent crying. Endocrine Not Present- Cold Intolerance, Excessive Hunger, Hair Changes, Heat Intolerance, Hot flashes and New Diabetes. Hematology Not Present- Blood Thinners, Easy Bruising, Excessive bleeding, Gland problems, HIV and Persistent Infections.  Vitals:   08/30/17 1406 09/01/17 1235 09/01/17 1325  BP:  130/78   Pulse:  89   Resp:  16   Temp:  98.7 F (37.1 C)   TempSrc:  Oral   SpO2:  99%   Weight: 83.9 kg (185 lb) 84.6 kg (186 lb 9.6 oz)   Height: 5\' 6"  (1.676 m)  5' 5.5" (1.664 m)    Physical Exam Constitutional: No acute distress; conversant; no deformities Eyes: Moist conjunctiva; no lid lag; anicteric sclerae; pupils equal round and reactive to light Neck: Trachea midline; no palpable thyromegaly Lungs: Normal respiratory effort; no tactile fremitus CV: Regular rate and rhythm; no palpable thrill; no pitting edema GI: Abdomen soft, nontender, nondistended;  no palpable hepatosplenomegaly Skin: Left  anterior buttock with 4 open draining sinuses consistent with hidradenitis; multiple scars along the entire left buttock consistent with prior hidradenitis disease. No palpable inflammation or tracks towards the anus/rectum and the intervening tissue. A digital rectal exam and anoscopy was deferred per patient preference with plans to perform in OR. 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 The pathophysiology of hidradenitis was discussed at length with associated pictures. We discussed operative and nonoperative approaches to treatment of this disease. Given interfering with her quality of life she has opted to pursue surgery. -We will plan anorectal exam under anesthesia with excision of hidradenitis. I discussed leaving the wound open versus closing. We discussed that if we close the wound healing could be faster the risk of wound infection is much higher. If this were to occur would require the wound to be opened and managed in an open fashion. She has acknowledged this risk and requested we close the wound, recognizing the infection risk. -The planned procedure, material risks (including, but not limited to, pain, bleeding, infection, scarring, need for additional procedures, recurrence, damage to surrounding structures) benefits and alternatives to surgery were discussed at length. I noted a good probability that the procedure would help improve her symptoms. The patient's questions were answered to her satisfaction, she voiced understanding and elected to proceed with surgery. Additionally, we discussed  typical postoperative expectations and the recovery process.  Gabrielle Armstrong, M.D. General and Colorectal Surgery [facility]

## 2017-09-01 NOTE — Transfer of Care (Signed)
Immediate Anesthesia Transfer of Care Note  Patient: Kassadi Presswood  Procedure(s) Performed: Procedure(s) (LRB): EXCISION HIDRADENITIS OF LEFT BUTTOCK (Left) ANAL EXAM UNDER ANESTHESIA (N/A)  Patient Location: PACU  Anesthesia Type: General  Level of Consciousness: awake, oriented, sedated and patient cooperative  Airway & Oxygen Therapy: Patient Spontanous Breathing and Patient connected to face mask oxygen  Post-op Assessment: Report given to PACU RN and Post -op Vital signs reviewed and stable  Post vital signs: Reviewed and stable  Complications: No apparent anesthesia complications Last Vitals:  Vitals Value Taken Time  BP 117/52 09/01/2017  4:47 PM  Temp    Pulse 90 09/01/2017  4:51 PM  Resp 16 09/01/2017  4:51 PM  SpO2 100 % 09/01/2017  4:51 PM  Vitals shown include unvalidated device data.  Last Pain:  Vitals:   09/01/17 1325  TempSrc:   PainSc: 2       Patients Stated Pain Goal: 5 (09/01/17 1325)

## 2017-09-02 ENCOUNTER — Encounter (HOSPITAL_BASED_OUTPATIENT_CLINIC_OR_DEPARTMENT_OTHER): Payer: Self-pay | Admitting: Surgery

## 2017-09-27 MED FILL — Bupivacaine Liposome Inj 1.3% (13.3 MG/ML): INTRAMUSCULAR | Qty: 20 | Status: AC

## 2018-01-17 ENCOUNTER — Encounter (HOSPITAL_COMMUNITY): Payer: Self-pay | Admitting: *Deleted

## 2018-01-17 ENCOUNTER — Other Ambulatory Visit: Payer: Self-pay

## 2018-01-17 ENCOUNTER — Emergency Department (HOSPITAL_COMMUNITY): Payer: BLUE CROSS/BLUE SHIELD

## 2018-01-17 DIAGNOSIS — R0602 Shortness of breath: Secondary | ICD-10-CM | POA: Diagnosis present

## 2018-01-17 DIAGNOSIS — Z5321 Procedure and treatment not carried out due to patient leaving prior to being seen by health care provider: Secondary | ICD-10-CM | POA: Insufficient documentation

## 2018-01-17 LAB — BASIC METABOLIC PANEL
Anion gap: 11 (ref 5–15)
BUN: 9 mg/dL (ref 6–20)
CHLORIDE: 105 mmol/L (ref 98–111)
CO2: 23 mmol/L (ref 22–32)
Calcium: 9.2 mg/dL (ref 8.9–10.3)
Creatinine, Ser: 0.81 mg/dL (ref 0.44–1.00)
GFR calc Af Amer: >60 mL/min (ref >60)
GFR calc non Af Amer: >60 mL/min (ref >60)
Glucose, Bld: 233 mg/dL — ABNORMAL HIGH (ref 70–99)
POTASSIUM: 3.4 mmol/L — AB (ref 3.5–5.1)
Sodium: 139 mmol/L (ref 135–145)

## 2018-01-17 LAB — CBC
HEMATOCRIT: 40.4 % (ref 36.0–46.0)
Hemoglobin: 14.1 g/dL (ref 12.0–15.0)
MCH: 29.9 pg (ref 26.0–34.0)
MCHC: 34.9 g/dL (ref 30.0–36.0)
MCV: 85.6 fL (ref 78.0–100.0)
PLATELETS: 388 10*3/uL (ref 150–400)
RBC: 4.72 MIL/uL (ref 3.87–5.11)
RDW: 13.1 % (ref 11.5–15.5)
WBC: 8.7 10*3/uL (ref 4.0–10.5)

## 2018-01-17 LAB — PREGNANCY, URINE: Preg Test, Ur: NEGATIVE

## 2018-01-17 LAB — TROPONIN I: Troponin I: <0.03 ng/mL (ref <0.03)

## 2018-01-17 NOTE — ED Triage Notes (Signed)
Pt reports shortness of breath, chest tightness, and elevated heart rate that started about 30 minutes at rest. She said she has been exhausted and only slept two hours last night.

## 2018-01-17 NOTE — ED Notes (Signed)
Patient transported to X-ray 

## 2018-01-18 ENCOUNTER — Emergency Department (HOSPITAL_COMMUNITY)
Admission: EM | Admit: 2018-01-18 | Discharge: 2018-01-18 | Discharge status: Against Medical Advice | Payer: BLUE CROSS/BLUE SHIELD | Attending: Emergency Medicine | Admitting: Emergency Medicine

## 2018-03-02 IMAGING — US US PELVIS COMPLETE TRANSABD/TRANSVAG
1 series · 13 of 25 positions shown · non-contrast
Comparison: None.

CLINICAL DATA: Heavy vaginal bleeding for the past 3 days.



[Series 1: us pelvis complete transabd/transvag · 0.20mm/px · 13 of 104 slices shown]
[im 1/104]
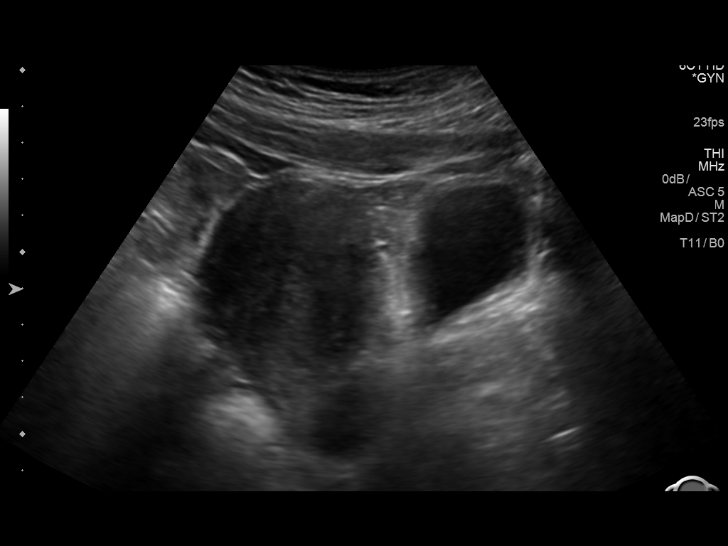
[im 9/104]
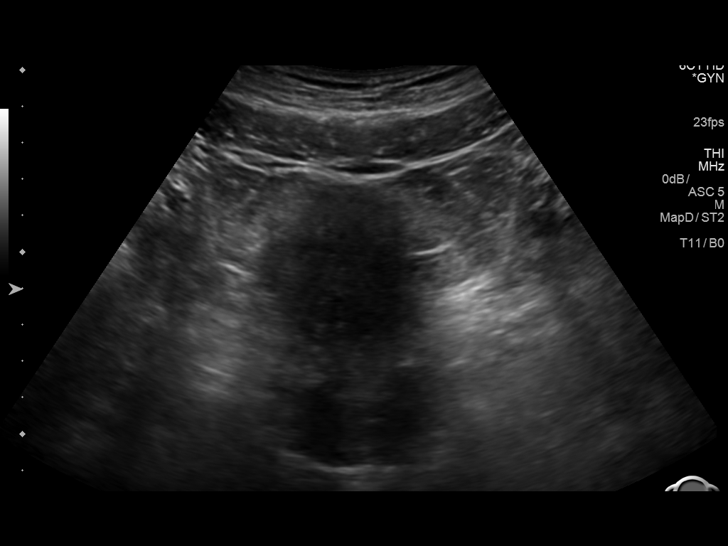
[im 18/104]
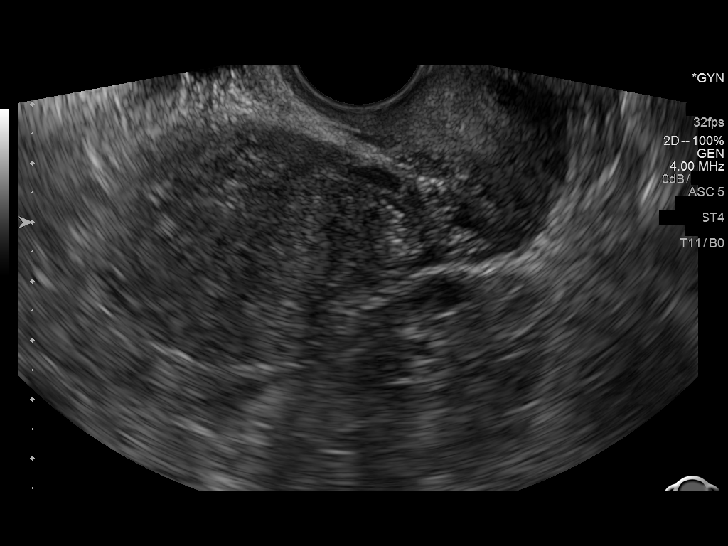
[im 26/104]
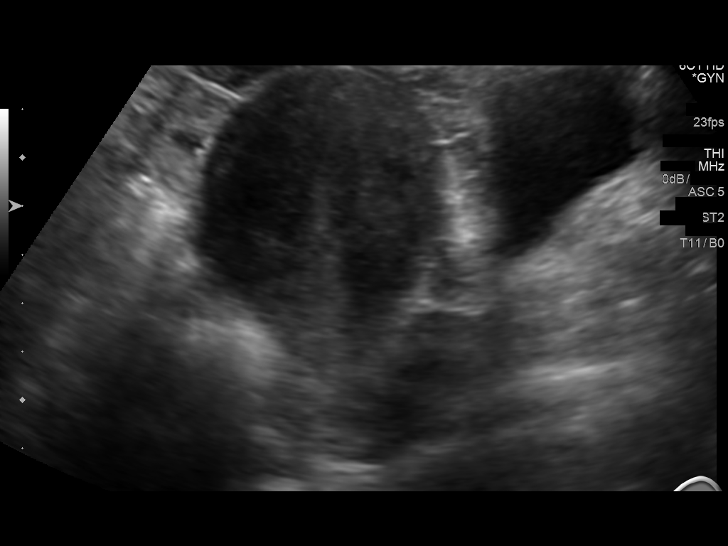
[im 35/104]
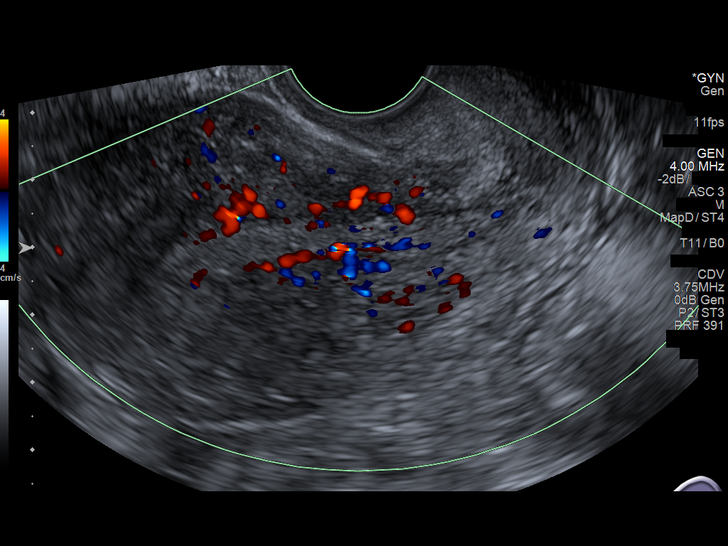
[im 43/104]
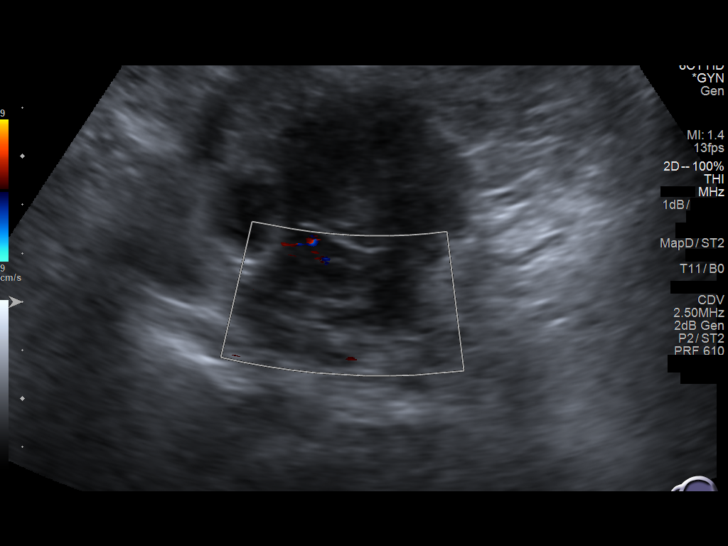
[im 52/104]
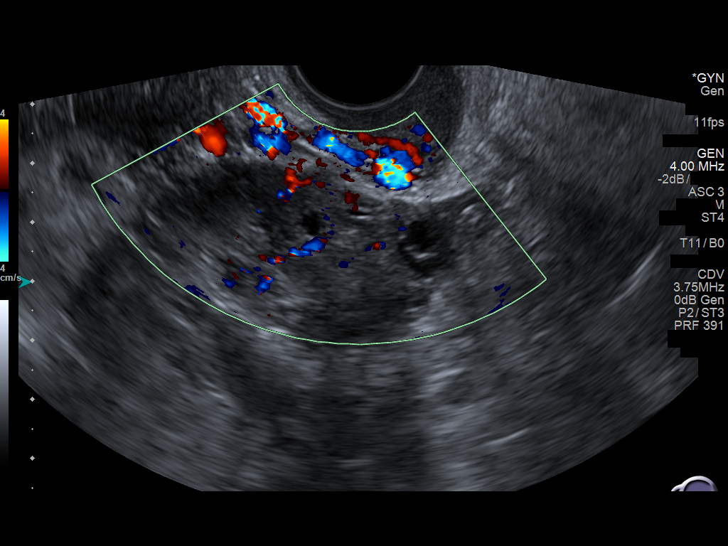
[im 61/104]
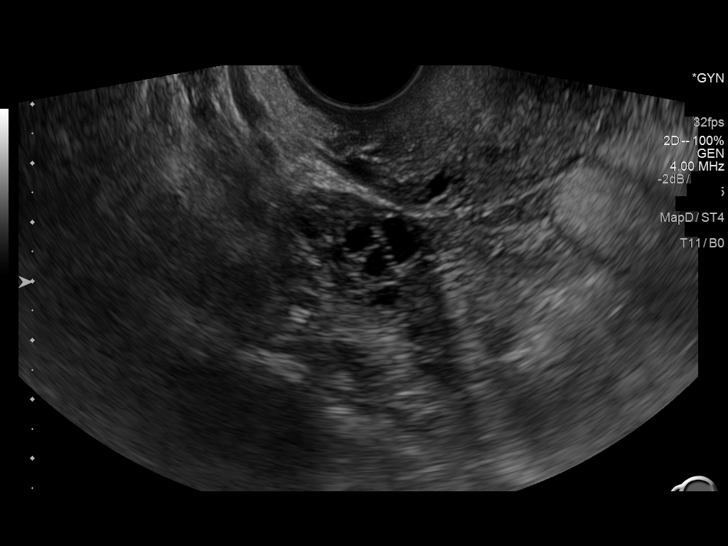
[im 69/104]
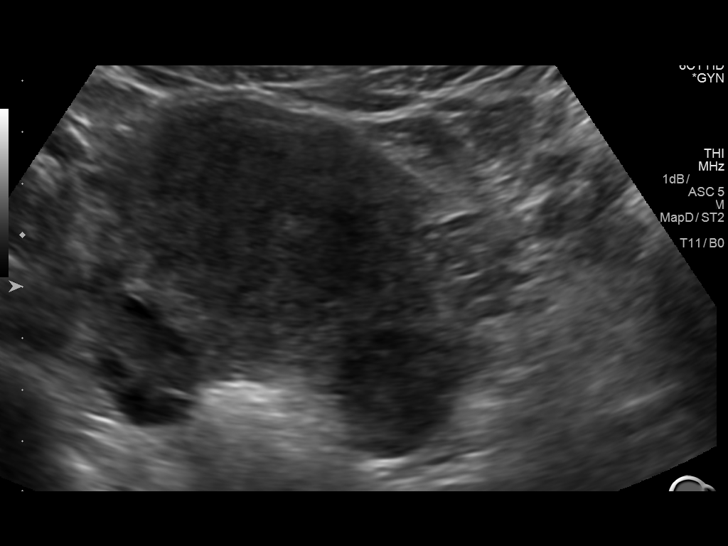
[im 78/104]
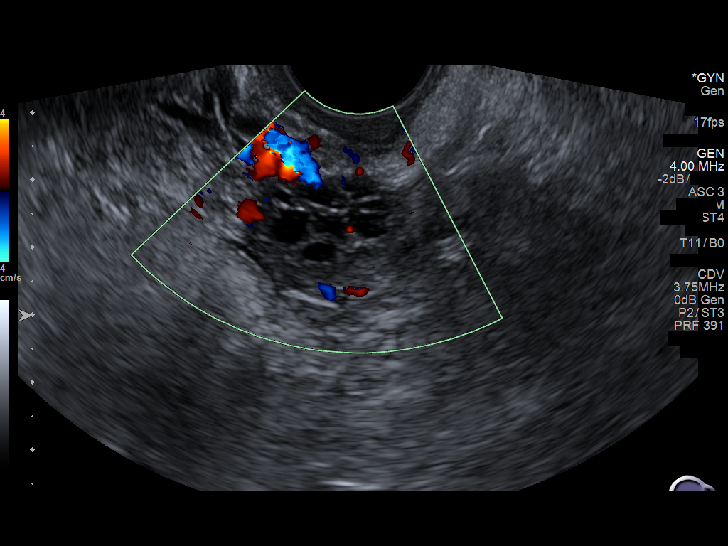
[im 86/104]
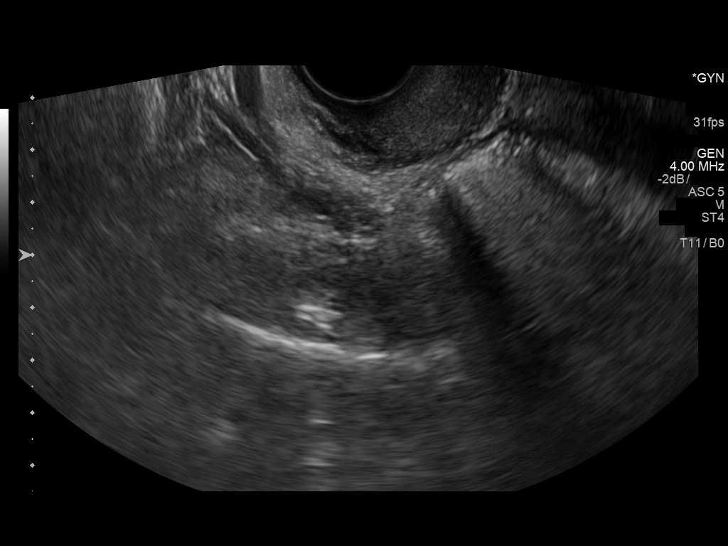
[im 95/104]
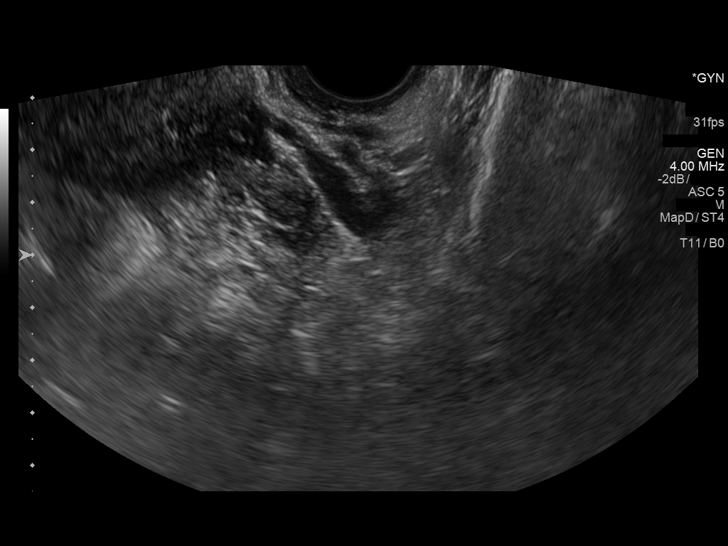
[im 104/104]
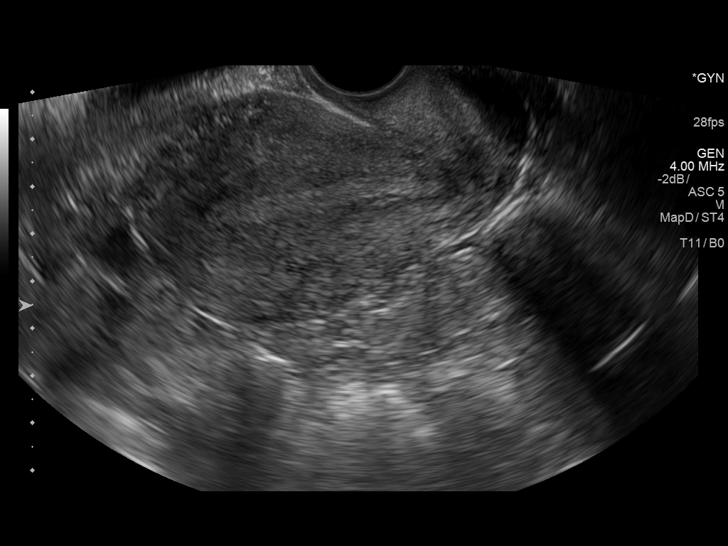

[13 of 25 positions shown; findings below may reference images not displayed]

FINDINGS: Uterus

Measurements: 8.7 x 4.9 x 5.3 cm. Slightly heterogeneous in
echotexture. No fibroids or other mass visualized.

Endometrium

Thickness: 7 mm. Heterogeneous with some internal vascularity. No
focal abnormality visualized.

Right ovary

Measurements: 4.5 x 2.4 x 2.5 cm. Normal appearance/no adnexal mass.

Left ovary

Measurements: 2.9 x 2.8 x 3.0 cm. Normal appearance/no adnexal mass.

Other findings

No abnormal free fluid.
IMPRESSION: 1. Heterogeneous endometrium, within normal limits in thickness. No
focal abnormality. If bleeding remains unresponsive to hormonal or
medical therapy, sonohysterogram should be considered for focal
lesion work-up. (Ref: Radiological Reasoning: Algorithmic Workup of
Abnormal Vaginal Bleeding with Endovaginal Sonography and
Sonohysterography. AJR [id number]; [id number])

## 2019-07-18 IMAGING — CR DG CHEST 2V
2 series · 2 of 2 positions shown · non-contrast
Comparison: None.

CLINICAL DATA: Short of breath.  Chest tightness.

EXAM:
CHEST - 2 VIEW

[w chest pa]
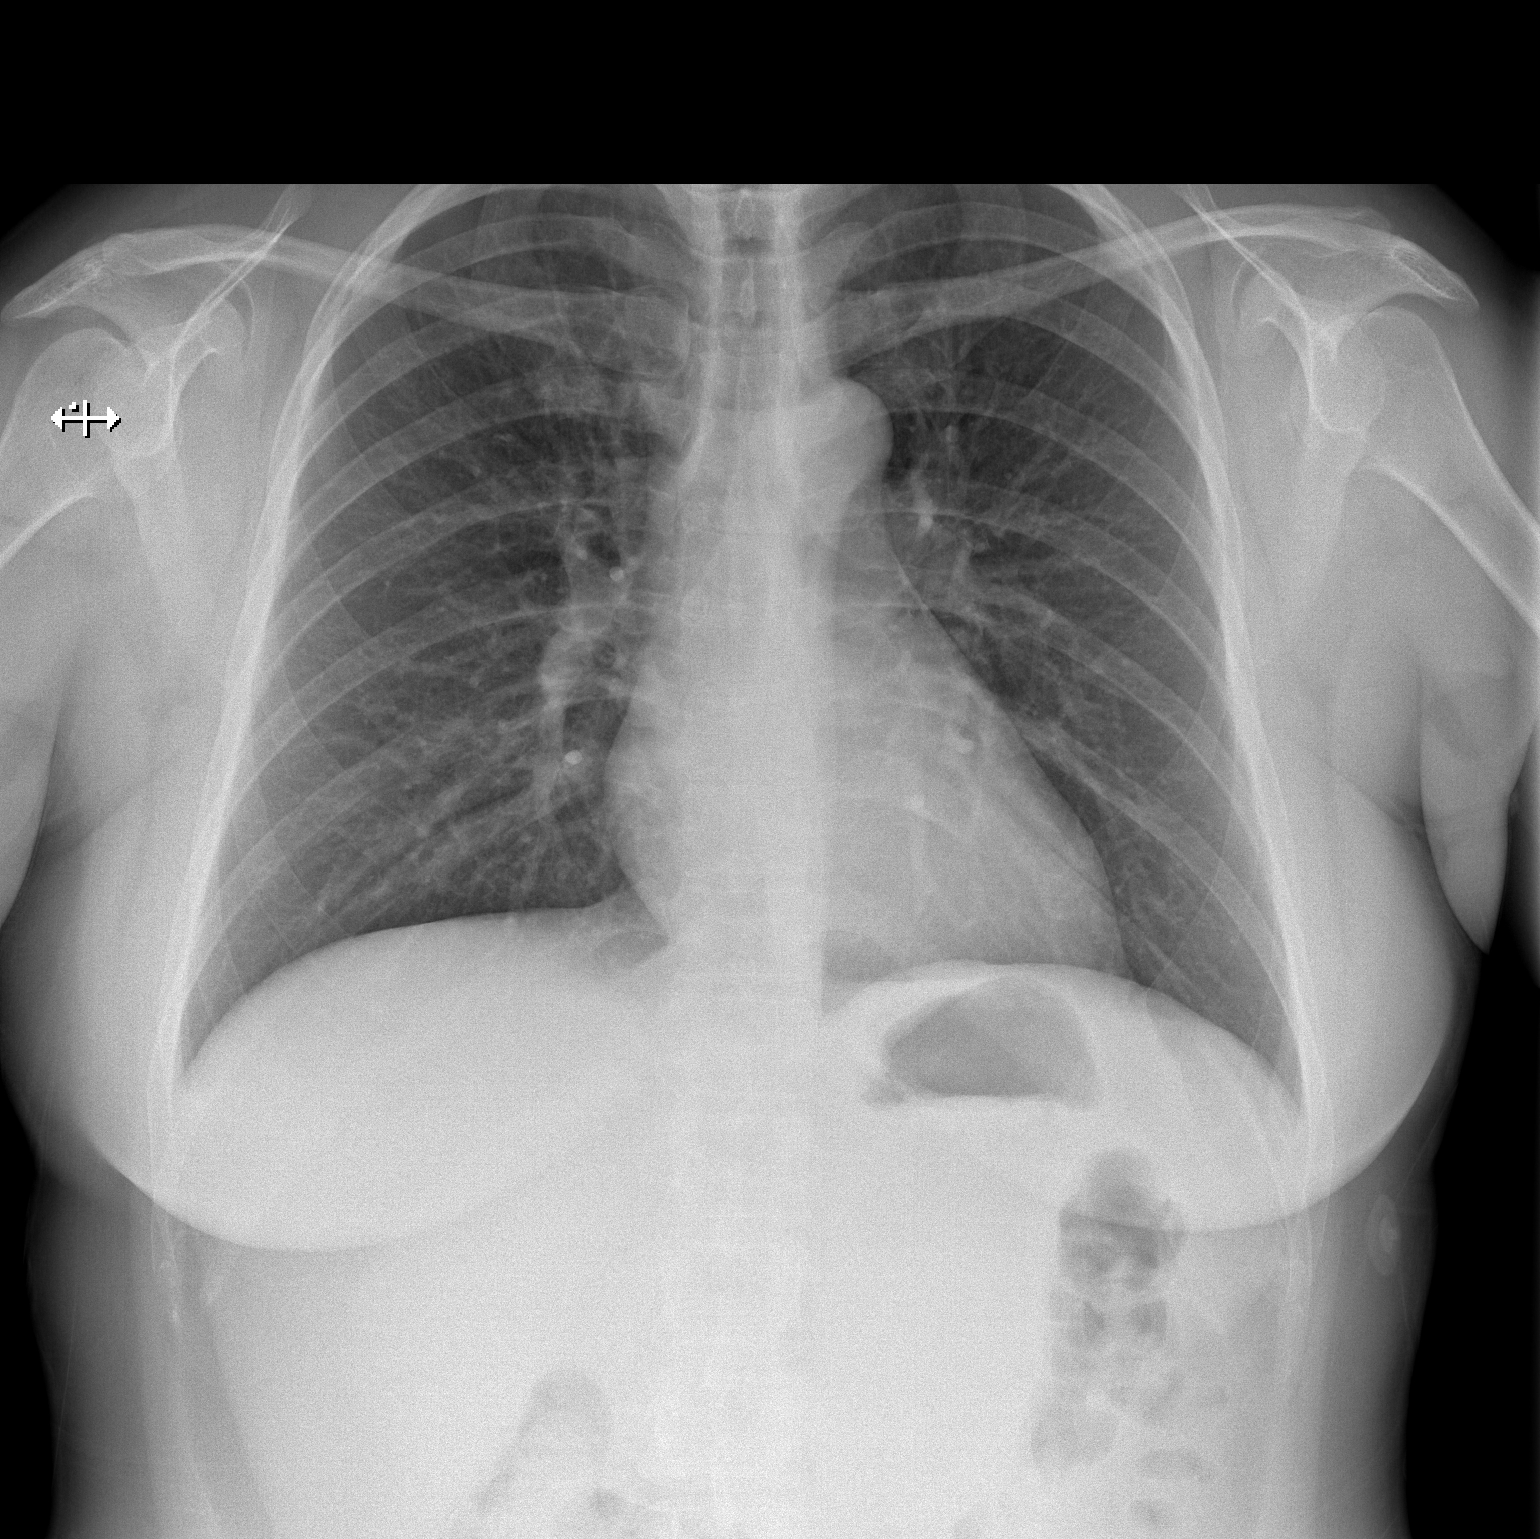

[w chest lat]
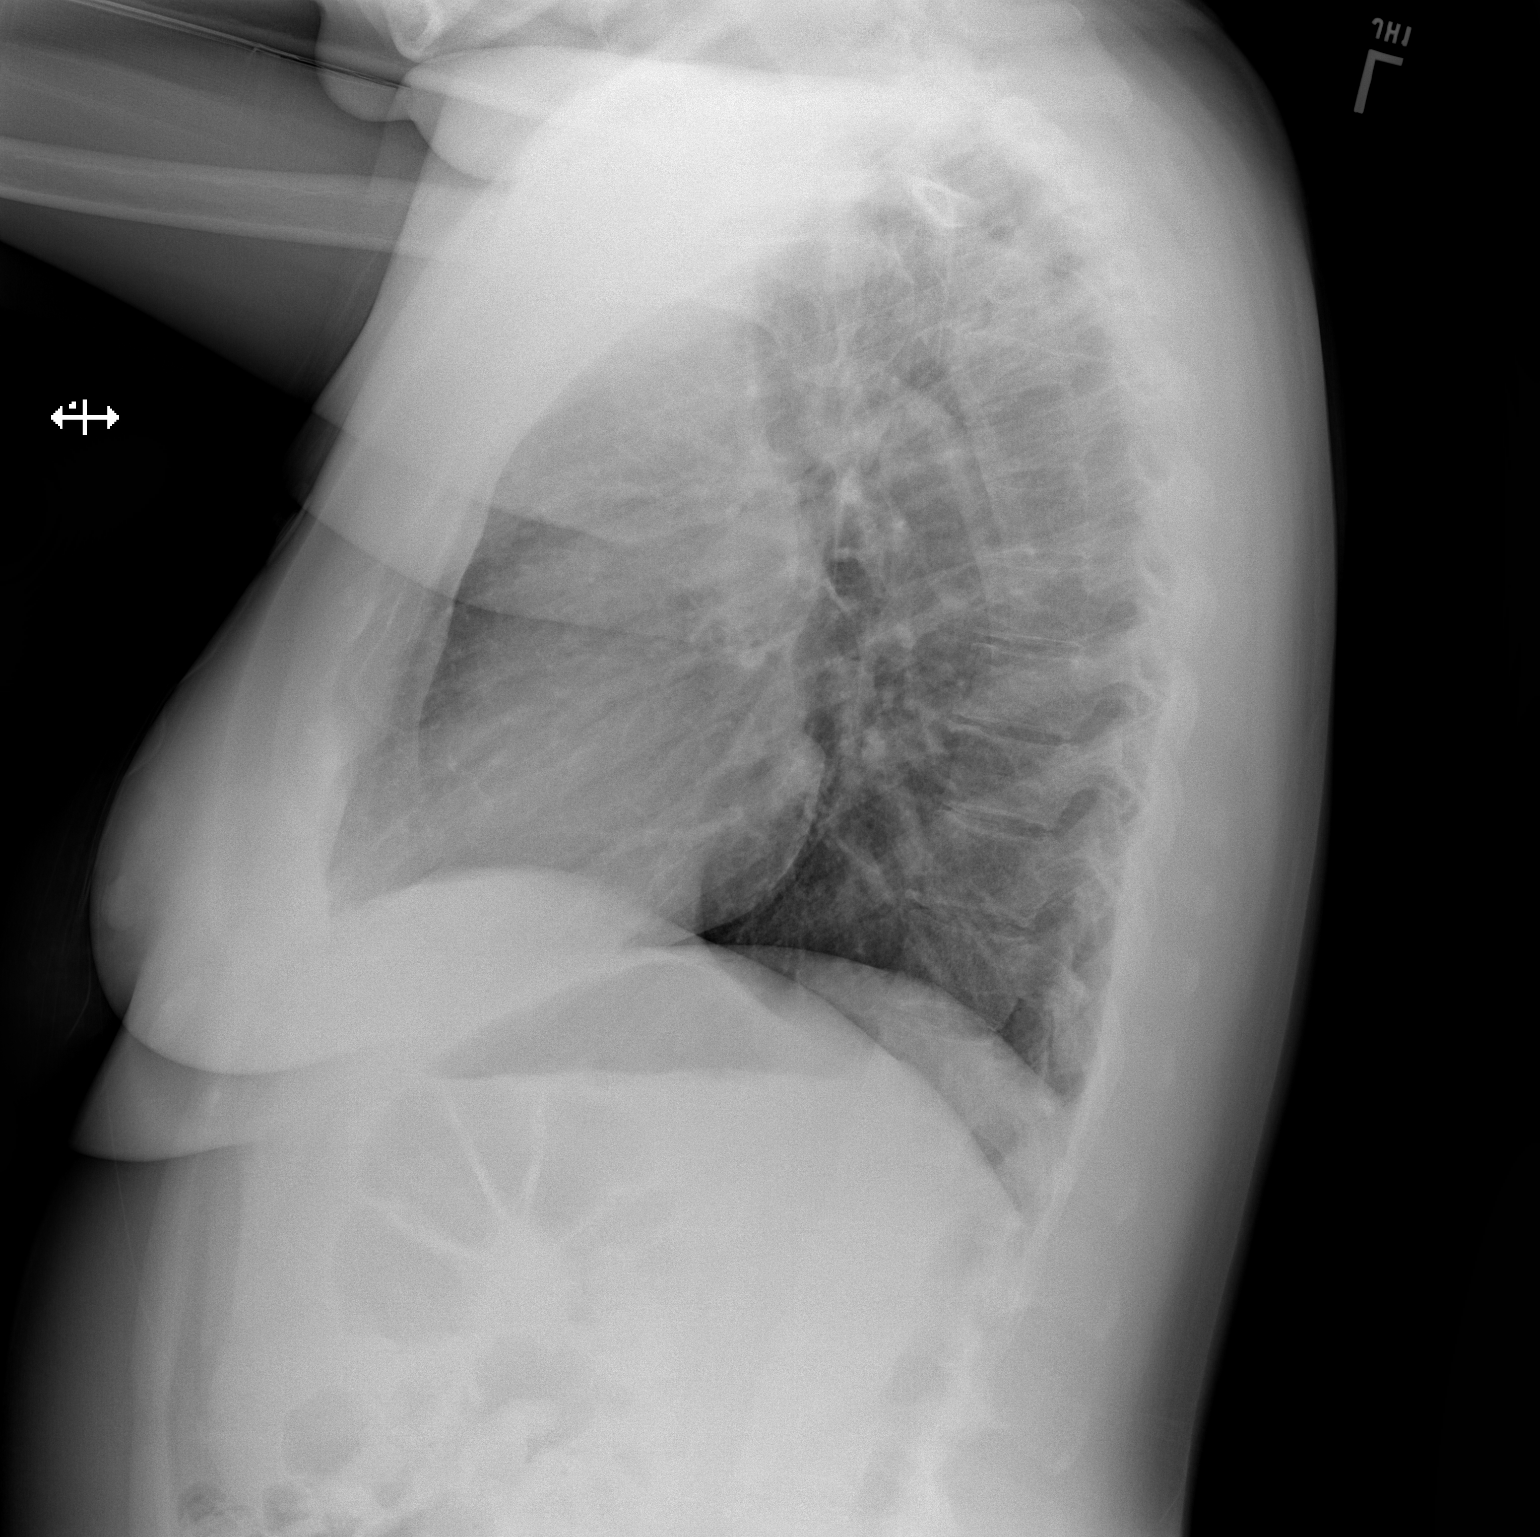

[2 of 2 positions shown; findings below may reference images not displayed]

FINDINGS: Normal heart size. Lungs clear. No pneumothorax. No pleural
effusion.
IMPRESSION: No active cardiopulmonary disease.
